# Patient Record
Sex: Male | Born: 1970 | Race: White | Hispanic: No
Health system: Southern US, Community
[De-identification: ages and names within clinical notes are randomized; demographics above are authoritative.]

---

## 2021-12-31 ENCOUNTER — Ambulatory Visit: Payer: Federal, State, Local not specified - PPO | Admitting: Podiatry

## 2021-12-31 ENCOUNTER — Other Ambulatory Visit: Payer: Self-pay

## 2021-12-31 ENCOUNTER — Encounter: Payer: Self-pay | Admitting: Podiatry

## 2021-12-31 ENCOUNTER — Ambulatory Visit (INDEPENDENT_AMBULATORY_CARE_PROVIDER_SITE_OTHER): Payer: Federal, State, Local not specified - PPO

## 2021-12-31 DIAGNOSIS — M79672 Pain in left foot: Secondary | ICD-10-CM | POA: Diagnosis not present

## 2021-12-31 DIAGNOSIS — M722 Plantar fascial fibromatosis: Secondary | ICD-10-CM | POA: Diagnosis not present

## 2021-12-31 DIAGNOSIS — M79671 Pain in right foot: Secondary | ICD-10-CM

## 2021-12-31 MED ORDER — DEXAMETHASONE SODIUM PHOSPHATE 120 MG/30ML IJ SOLN
4.0000 mg | Freq: Once | INTRAMUSCULAR | Status: AC
  Administered 2021-12-31: 4 mg via INTRA_ARTICULAR

## 2021-12-31 MED ORDER — MELOXICAM 15 MG PO TABS: 15.0000 mg | ORAL_TABLET | Freq: Every day | ORAL | 0 refills | Status: AC

## 2021-12-31 NOTE — Patient Instructions (Signed)

## 2021-12-31 NOTE — Progress Notes (Signed)
?  Subjective:  ?Patient ID: Wesley Phillips, male    DOB: 1971/04/23,   MRN: [id number] ? ?Chief Complaint  ?Patient presents with  ? Foot Pain  ?  Left foot pain , Patient states this has been ongoing since November , pain is mainly at the heel , patient states sometime it is hard to walk on foot   ? ? ?51 y.o. male presents for concern of bilateral foot pain   that has been going on for several months. Most of the pain is in the bottom of his left heel and hurts primarily when he gets up in the morning.  He has been icing every night. . Denies any other pedal complaints. Denies n/v/f/c.  ? ?History reviewed. No pertinent past medical history. ? ?Objective:  ?Physical Exam: ?Vascular: DP/PT pulses 2/4 bilateral. CFT <3 seconds. Normal hair growth on digits. No edema.  ?Skin. No lacerations or abrasions bilateral feet.  ?Musculoskeletal: MMT 5/5 bilateral lower extremities in DF, PF, Inversion and Eversion. Deceased ROM in DF of ankle joint. Tenderness to bilateral medial calcaneal tubercle more so on the left. No pain along arch PT tendon or achilles tendon. No pain with calcaneal squeeze.  ?Neurological: Sensation intact to light touch.  ? ?Assessment:  ? ?1. Plantar fasciitis of left foot   ?2. Plantar fasciitis of right foot   ? ? ? ?Plan:  ?Patient was evaluated and treated and all questions answered. ?X-rays reviewed and discussed with patient. No acute fractures or dislocations noted.  Mild spurring noted to the calcaneus bilateral.  ?Discussed plantar fasciitis with patient.  ?X-rays reviewed and discussed with patient. No acute fractures or dislocations noted. Mild spurring noted at inferior calcaneus.  ?Discussed treatment options including, ice, NSAIDS, supportive shoes, bracing, and stretching. Stretching exercises provided to be done on a daily basis.   ?Prescription for meloxicam provided and sent to pharmacy. ?Patient requesting injection today. Procedure note below.   ?Dispensed PF brace  ?Follow-up 6  weeks or sooner if any problems arise. In the meantime, encouraged to call the office with any questions, concerns, change in symptoms.  ? ?Procedure:  ?Discussed etiology, pathology, conservative vs. surgical therapies. At this time a plantar fascial injection was recommended.  The patient agreed and a sterile skin prep was applied.  An injection consisting of  dexamethasone and marcaine mixture was infiltrated at the point of maximal tenderness on the left Heel.  Bandaid applied. The patient tolerated this well and was given instructions for aftercare.   ? ? ?Lorenda Peck, DPM  ? ? ?

## 2022-02-11 ENCOUNTER — Ambulatory Visit: Payer: Federal, State, Local not specified - PPO | Admitting: Podiatry

## 2022-02-11 ENCOUNTER — Encounter: Payer: Self-pay | Admitting: Podiatry

## 2022-02-11 DIAGNOSIS — M722 Plantar fascial fibromatosis: Secondary | ICD-10-CM | POA: Diagnosis not present

## 2022-02-11 MED ORDER — DEXAMETHASONE SODIUM PHOSPHATE 120 MG/30ML IJ SOLN
4.0000 mg | Freq: Once | INTRAMUSCULAR | Status: AC
  Administered 2022-02-11: 4 mg via INTRA_ARTICULAR

## 2022-02-11 NOTE — Progress Notes (Signed)
?  Subjective:  ?Patient ID: Wesley Phillips, male    DOB: 1971-05-02,   MRN: [id number] ? ?No chief complaint on file. ? ? ?51 y.o. male presents for follow-up of  bilateral plantar fasciitis but primarily the left. Relates he has not had too much improvement. Relates the pain is worse in the morning still. Relates icing helps and that's about it. Relates the injection helped only for about 3-4 days.    . Denies any other pedal complaints. Denies n/v/f/c.  ? ?No past medical history on file. ? ?Objective:  ?Physical Exam: ?Vascular: DP/PT pulses 2/4 bilateral. CFT <3 seconds. Normal hair growth on digits. No edema.  ?Skin. No lacerations or abrasions bilateral feet.  ?Musculoskeletal: MMT 5/5 bilateral lower extremities in DF, PF, Inversion and Eversion. Deceased ROM in DF of ankle joint. Tenderness to left medial calcaneal tubercle. No pain along arch PT tendon or achilles tendon. No pain with calcaneal squeeze.  ?Neurological: Sensation intact to light touch.  ? ?Assessment:  ? ?1. Plantar fasciitis of left foot   ?2. Plantar fasciitis of right foot   ? ? ? ? ?Plan:  ?Patient was evaluated and treated and all questions answered. ?X-rays reviewed and discussed with patient. No acute fractures or dislocations noted.  Mild spurring noted to the calcaneus bilateral.  ?Discussed plantar fasciitis with patient.  ?X-rays reviewed and discussed with patient. No acute fractures or dislocations noted. Mild spurring noted at inferior calcaneus.  ?Discussed treatment options including, ice, NSAIDS, supportive shoes, bracing, and stretching.  ?Continue stretching, brace and meloxicam.  ?Patient requesting injection today. Procedure note below.   ?Follow-up 6 weeks or sooner if any problems arise. In the meantime, encouraged to call the office with any questions, concerns, change in symptoms.  ? ?Procedure:  ?Discussed etiology, pathology, conservative vs. surgical therapies. At this time a plantar fascial injection was  recommended.  The patient agreed and a sterile skin prep was applied.  An injection consisting of  dexamethasone and marcaine mixture was infiltrated at the point of maximal tenderness on the left Heel.  Bandaid applied. The patient tolerated this well and was given instructions for aftercare.   ? ? ?Lorenda Peck, DPM  ? ? ?

## 2022-03-31 ENCOUNTER — Encounter: Payer: Self-pay | Admitting: Podiatry

## 2022-03-31 ENCOUNTER — Ambulatory Visit: Payer: Federal, State, Local not specified - PPO | Admitting: Podiatry

## 2022-03-31 DIAGNOSIS — M722 Plantar fascial fibromatosis: Secondary | ICD-10-CM

## 2022-03-31 NOTE — Progress Notes (Signed)
  Subjective:  Patient ID: Wesley Phillips, male    DOB: January 01, 1971,   MRN: [id number]  Chief Complaint  Patient presents with   Plantar Fasciitis    F/u left foot plantar fascitis     51 y.o. male presents for follow-up of  bilateral plantar fasciitis but primarily the left. Relates he is feeling better since last visit about 65% . States this last injection was helpful. He has also been stretching which has been helpful. Stopped taking meloxicam. Relates sore at times especially when playing pickleball  . Denies any other pedal complaints. Denies n/v/f/c.   History reviewed. No pertinent past medical history.  Objective:  Physical Exam: Vascular: DP/PT pulses 2/4 bilateral. CFT <3 seconds. Normal hair growth on digits. No edema.  Skin. No lacerations or abrasions bilateral feet.  Musculoskeletal: MMT 5/5 bilateral lower extremities in DF, PF, Inversion and Eversion. Deceased ROM in DF of ankle joint. Minimal tenderness to left medial calcaneal tubercle. No pain along arch PT tendon or achilles tendon. No pain with calcaneal squeeze.  Neurological: Sensation intact to light touch.   Assessment:   1. Plantar fasciitis of left foot   2. Plantar fasciitis of right foot        Plan:  Patient was evaluated and treated and all questions answered. X-rays reviewed and discussed with patient. No acute fractures or dislocations noted.  Mild spurring noted to the calcaneus bilateral.  Discussed plantar fasciitis with patient.  Discussed treatment options including, ice, NSAIDS, supportive shoes, bracing, and stretching.  Continue stretching, brace and anti-inflammatories as needed.  Did discuss CMO in the future to aid with support. Patient will consider this option.  Follow-up as needed    Louann Sjogren, DPM

## 2022-06-21 ENCOUNTER — Ambulatory Visit: Payer: Federal, State, Local not specified - PPO | Admitting: Podiatry

## 2022-06-21 DIAGNOSIS — L603 Nail dystrophy: Secondary | ICD-10-CM | POA: Diagnosis not present

## 2022-06-22 ENCOUNTER — Ambulatory Visit: Payer: Federal, State, Local not specified - PPO | Admitting: Podiatry

## 2022-06-28 NOTE — Progress Notes (Signed)
  Subjective:  Patient ID: Wesley Phillips, male    DOB: [birth year],  MRN: [id number]  Chief Complaint  Patient presents with   Nail Problem    51 y.o. male presents with the above complaint.  Patient presents with left hallux dystrophic nail.  Patient states the nail is coming off.  He states that he has been getting caught something is causing some pain.  He would like to have the nail removed.  He denies any other acute complaints he has not seen anyone else prior to seeing me for this.   Review of Systems: Negative except as noted in the HPI. Denies N/V/F/Ch.  No past medical history on file.  Current Outpatient Medications:    meloxicam (MOBIC) 15 MG tablet, Take 1 tablet (15 mg total) by mouth daily., Disp: 30 tablet, Rfl: 0  Social History   Tobacco Use  Smoking Status Not on file  Smokeless Tobacco Not on file    Not on File Objective:  There were no vitals filed for this visit. There is no height or weight on file to calculate BMI. Constitutional Well developed. Well nourished.  Vascular Dorsalis pedis pulses palpable bilaterally. Posterior tibial pulses palpable bilaterally. Capillary refill normal to all digits.  No cyanosis or clubbing noted. Pedal hair growth normal.  Neurologic Normal speech. Oriented to person, place, and time. Epicritic sensation to light touch grossly present bilaterally.  Dermatologic Pain on palpation of the entire/total nail on 1st digit of the left No other open wounds. No skin lesions.  Orthopedic: Normal joint ROM without pain or crepitus bilaterally. No visible deformities. No bony tenderness.   Radiographs: None Assessment:   1. Nail dystrophy    Plan:  Patient was evaluated and treated and all questions answered.  Nail contusion/dystrophy hallux, left -Patient elects to proceed with minor surgery to remove entire toenail today. Consent reviewed and signed by patient. -Entire/total nail excised. See procedure  note. -Educated on post-procedure care including soaking. Written instructions provided and reviewed. -Patient to follow up in 2 weeks for nail check.  Procedure: Excision of entire/total nail  Location: Left 1st toe digit Anesthesia: Lidocaine 1% plain; 1.5 mL and Marcaine 0.5% plain; 1.5 mL, digital block. Skin Prep: Betadine. Dressing: Silvadene; telfa; dry, sterile, compression dressing. Technique: Following skin prep, the toe was exsanguinated and a tourniquet was secured at the base of the toe. The affected nail border was freed and excised. The tourniquet was then removed and sterile dressing applied. Disposition: Patient tolerated procedure well. Patient to return in 2 weeks for follow-up.   No follow-ups on file.

## 2023-04-08 IMAGING — DX DG FOOT COMPLETE 3+V*R*
3 series · 3 of 3 positions shown · non-contrast
Comparison: None.

CLINICAL DATA: Plantar fasciitis of left foot.  Foot pain.

EXAM:
RIGHT FOOT COMPLETE - 3+ VIEW

[foot ap wb]
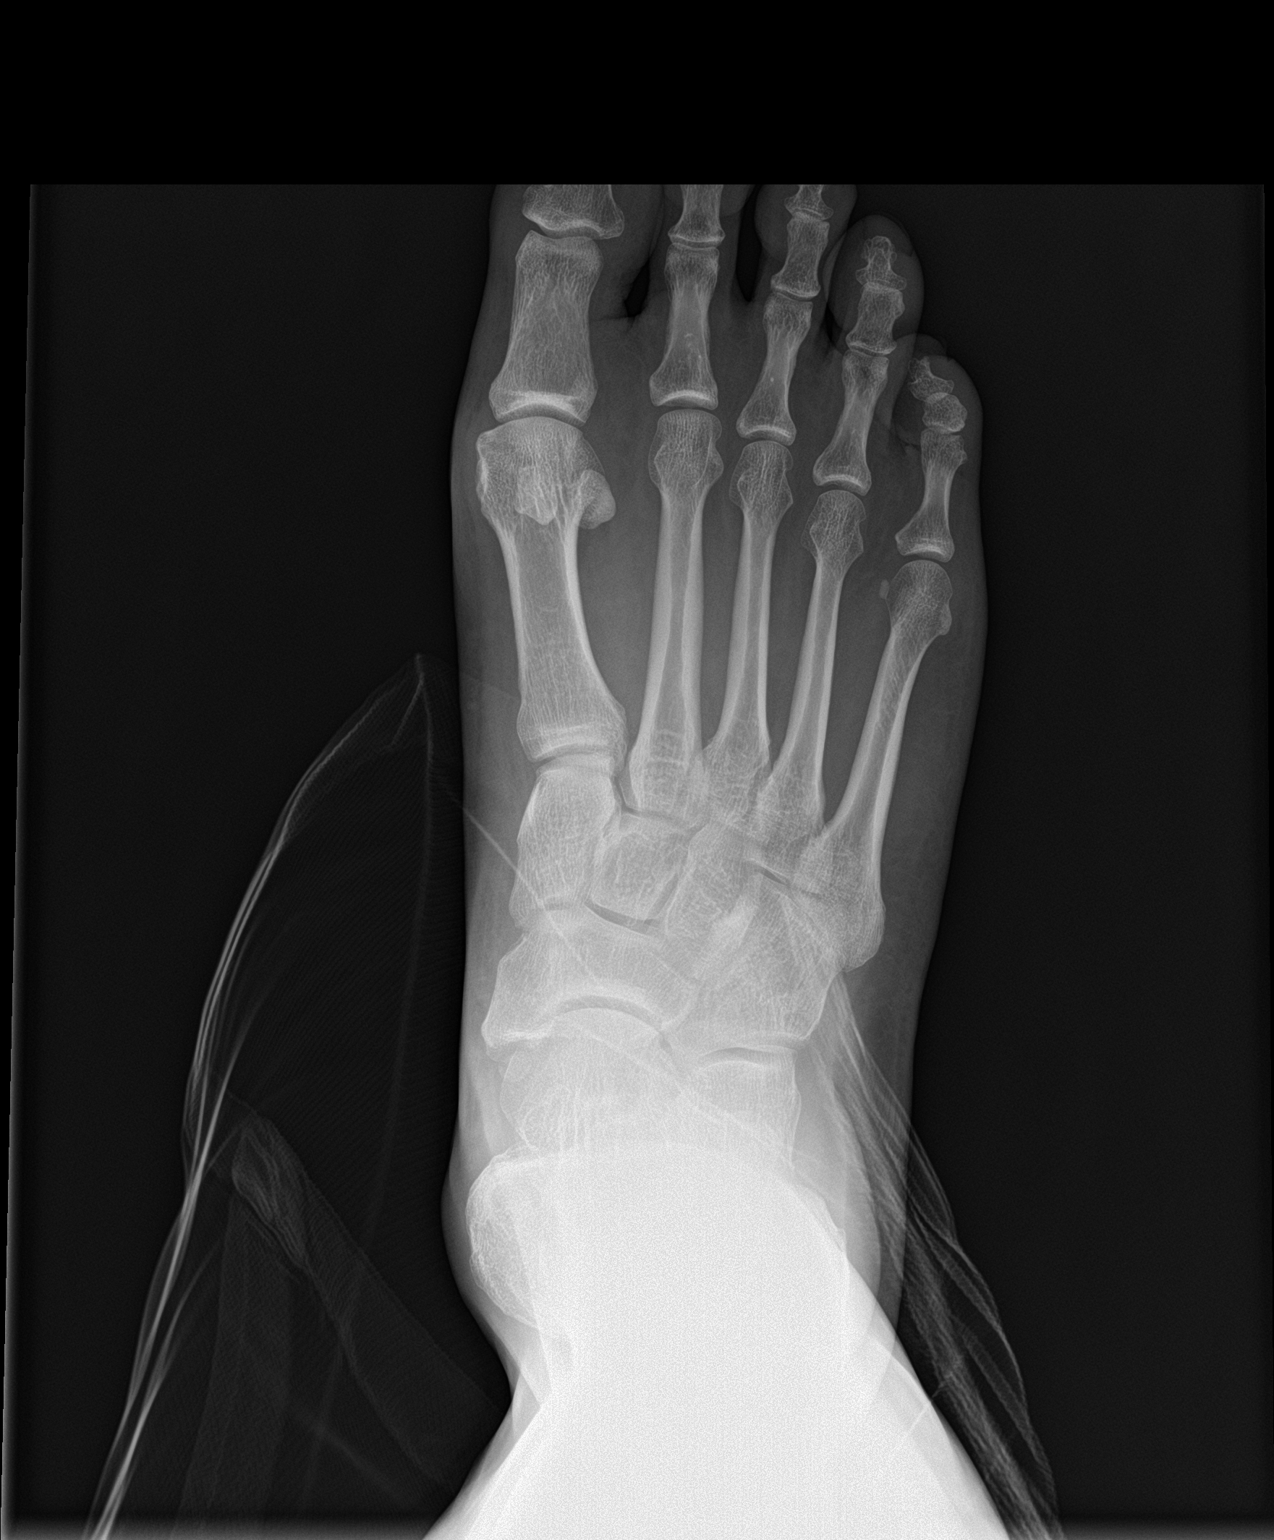

[foot obl wb]
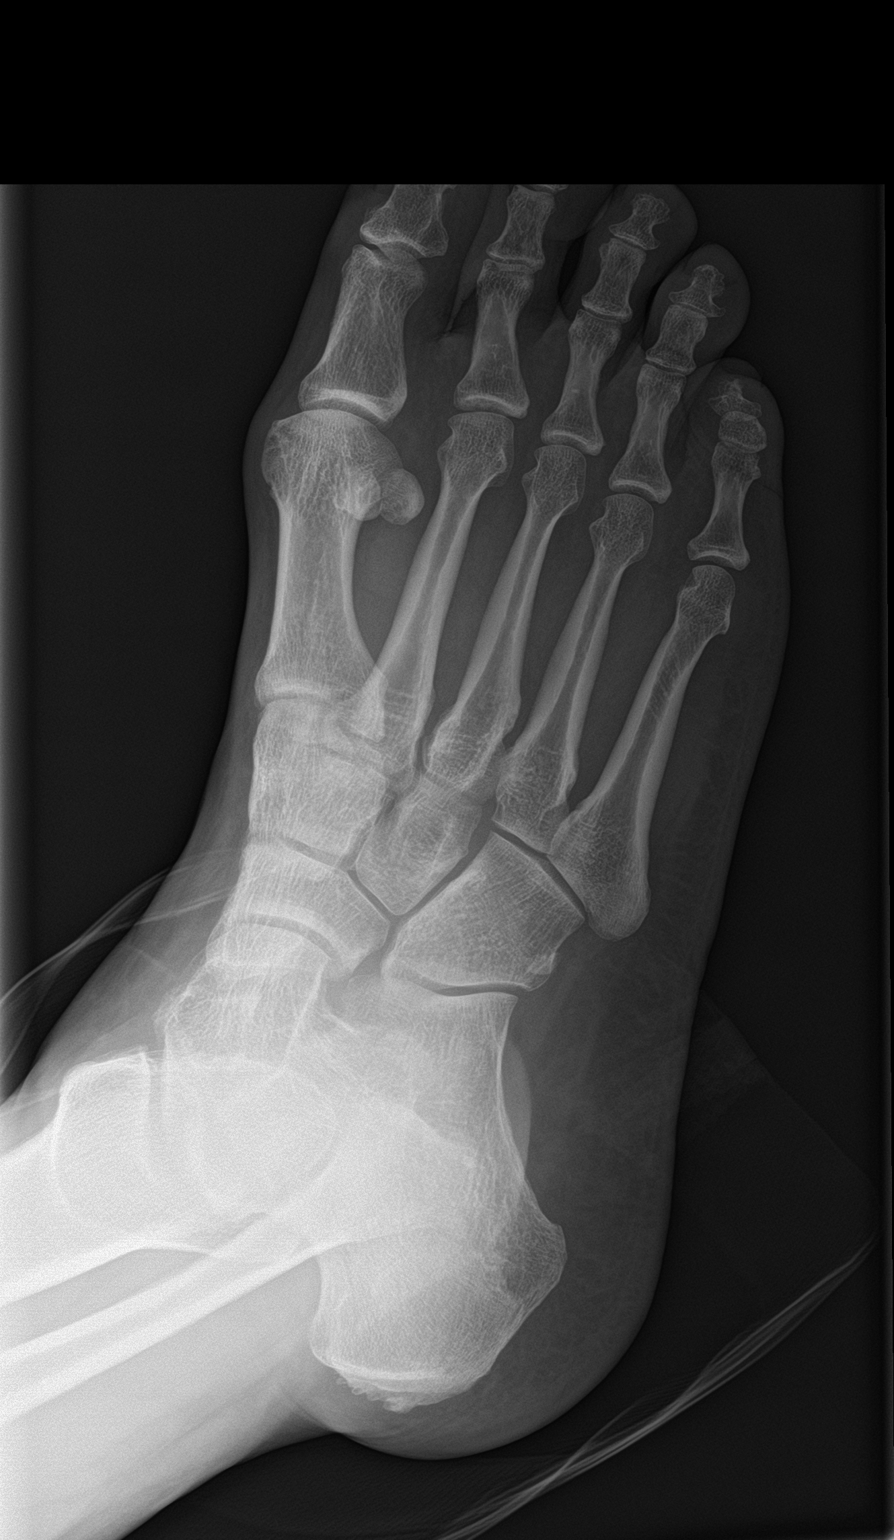

[foot lat wb]
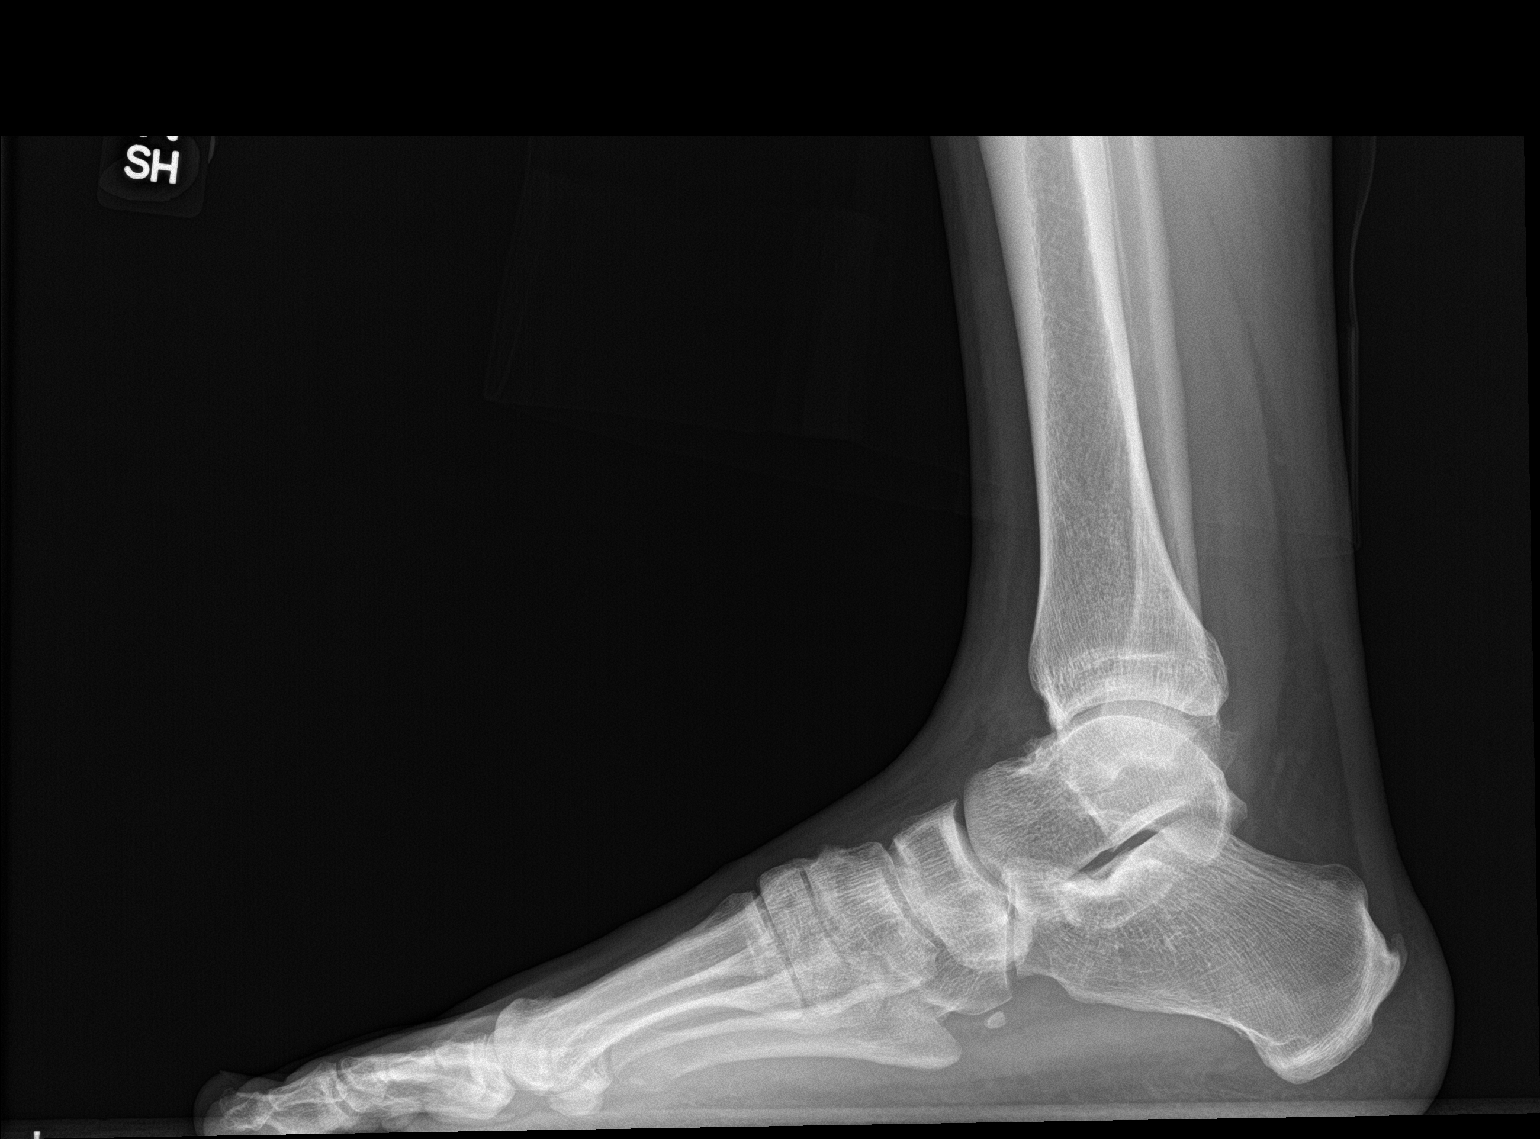

[3 of 3 positions shown; findings below may reference images not displayed]

FINDINGS: Imaging obtained weight-bearing. Normal alignment. Normal joint
spaces. Minimal Achilles tendon enthesophyte. No plantar calcaneal
spur. Trace degenerative spurring of the dorsal navicular. There is
an accessory os navicular and os peroneal. No erosion or periosteal
reaction. No fracture. Unremarkable soft tissues.
IMPRESSION: 1. Minimal Achilles tendon enthesophyte.
2. Accessory os navicular.
3. Trace degenerative spurring of the dorsal navicular.
